# Patient Record
Sex: Female | Born: 1990 | Race: Black or African American | Hispanic: No | Marital: Single | Smoking: Never smoker
Health system: Southern US, Community
[De-identification: ages and names within clinical notes are randomized; demographics above are authoritative.]

## PROBLEM LIST (undated history)

## (undated) ENCOUNTER — Inpatient Hospital Stay (HOSPITAL_COMMUNITY): Payer: Self-pay

## (undated) DIAGNOSIS — B999 Unspecified infectious disease: Secondary | ICD-10-CM

## (undated) DIAGNOSIS — N390 Urinary tract infection, site not specified: Secondary | ICD-10-CM

## (undated) DIAGNOSIS — A6000 Herpesviral infection of urogenital system, unspecified: Secondary | ICD-10-CM

## (undated) DIAGNOSIS — B379 Candidiasis, unspecified: Secondary | ICD-10-CM

## (undated) HISTORY — PX: NO PAST SURGERIES: SHX2092

---

## 2012-04-21 ENCOUNTER — Ambulatory Visit (INDEPENDENT_AMBULATORY_CARE_PROVIDER_SITE_OTHER): Payer: BC Managed Care – PPO | Admitting: Physician Assistant

## 2012-04-21 VITALS — BP 102/60 | HR 76 | Temp 98.6°F | Resp 16 | Ht 64.5 in | Wt 256.0 lb

## 2012-04-21 DIAGNOSIS — R21 Rash and other nonspecific skin eruption: Secondary | ICD-10-CM

## 2012-04-21 DIAGNOSIS — Z2089 Contact with and (suspected) exposure to other communicable diseases: Secondary | ICD-10-CM

## 2012-04-21 MED ORDER — TRIAMCINOLONE ACETONIDE 0.1 % EX CREA: TOPICAL_CREAM | Freq: Two times a day (BID) | CUTANEOUS | Status: DC

## 2012-04-21 MED ORDER — IVERMECTIN 3 MG PO TABS: 3.0000 mg | ORAL_TABLET | Freq: Once | ORAL | Status: DC

## 2012-04-21 NOTE — Progress Notes (Signed)
  Subjective:    Patient ID: Brandi Farmer, female    DOB: 10-04-1991, 21 y.o.   MRN: [id number]  HPI  Pt presents to clinic after an exposure to scabies.  Her mother has recently been diagnosed and she lives at home.  She has a rash on her chest bilaterally that is itchy and then some scattered rash on her wrists.  She thinks the rash has spread but also states she gets worried about catching things and she may be making up the itching.  She has not tried any medications.  Review of Systems  Constitutional: Negative for fever.  Skin: Positive for rash.       Objective:   Physical Exam  Vitals reviewed. Constitutional: She appears well-developed and well-nourished.  HENT:  Head: Normocephalic and atraumatic.  Right Ear: External ear normal.  Left Ear: External ear normal.  Nose: Nose normal.  Eyes: Conjunctivae are normal.  Pulmonary/Chest: Effort normal.  Skin: Skin is warm. Rash noted.             Assessment & Plan:   1. Rash  triamcinolone cream (KENALOG) 0.1 %  2. Scabies exposure  ivermectin (STROMECTOL) 3 MG TABS   D/w pt I believe her chest rash is a contact type rash - even though no new known exposures per patient. Gave her treatment in case she develops symptoms.

## 2012-04-21 NOTE — Patient Instructions (Signed)
Scabies Scabies are small bugs (mites) that burrow under the skin and cause red bumps and severe itching. These bugs can only be seen with a microscope. Scabies are highly contagious. They can spread easily from person to person by direct contact. They are also spread through sharing clothing or linens that have the scabies mites living in them. It is not unusual for an entire family to become infected through shared towels, clothing, or bedding.  HOME CARE INSTRUCTIONS   Your caregiver may prescribe a cream or lotion to kill the mites. If this cream is prescribed; massage the cream into the entire area of the body from the neck to the bottom of both feet. Also massage the cream into the scalp and face if your child is less than 1 year old. Avoid the eyes and mouth.   Leave the cream on for 8 to12 hours. Do not wash your hands after application. Your child should bathe or shower after the 8 to 12 hour application period. Sometimes it is helpful to apply the cream to your child at right before bedtime.   One treatment is usually effective and will eliminate approximately 95% of infestations. For severe cases, your caregiver may decide to repeat the treatment in 1 week. Everyone in your household should be treated with one application of the cream.   New rashes or burrows should not appear after successful treatment within 24 to 48 hours; however the itching and rash may last for 2 to 4 weeks after successful treatment. If your symptoms persist longer than this, see your caregiver.   Your caregiver also may prescribe a medication to help with the itching or to help the rash go away more quickly.   Scabies can live on clothing or linens for up to 3 days. Your entire child's recently used clothing, towels, stuffed toys, and bed linens should be washed in hot water and then dried in a dryer for at least 20 minutes on high heat. Items that cannot be washed should be enclosed in a plastic bag for at least 3  days.   To help relieve itching, bathe your child in a cool bath or apply cool washcloths to the affected areas.   Your child may return to school after treatment with the prescribed cream.  SEEK MEDICAL CARE IF:   The itching persists longer than 4 weeks after treatment.   The rash spreads or becomes infected (the area has red blisters or yellow-tan crust).  Document Released: 09/28/2005 Document Revised: 09/17/2011 Document Reviewed: 02/06/2009 [employer] Patient Information 2012 [employer]. 

## 2012-08-29 ENCOUNTER — Inpatient Hospital Stay (HOSPITAL_COMMUNITY)
Admission: AD | Admit: 2012-08-29 | Discharge: 2012-08-29 | Disposition: A | Discharge status: Home / Self Care | Payer: Self-pay | Source: Ambulatory Visit | Attending: Obstetrics & Gynecology | Admitting: Obstetrics & Gynecology

## 2012-08-29 ENCOUNTER — Encounter (HOSPITAL_COMMUNITY): Payer: Self-pay | Admitting: Family

## 2012-08-29 DIAGNOSIS — N760 Acute vaginitis: Secondary | ICD-10-CM | POA: Insufficient documentation

## 2012-08-29 DIAGNOSIS — N949 Unspecified condition associated with female genital organs and menstrual cycle: Secondary | ICD-10-CM | POA: Insufficient documentation

## 2012-08-29 HISTORY — DX: Candidiasis, unspecified: B37.9

## 2012-08-29 HISTORY — DX: Unspecified infectious disease: B99.9

## 2012-08-29 HISTORY — DX: Urinary tract infection, site not specified: N39.0

## 2012-08-29 LAB — WET PREP, GENITAL
Trich, Wet Prep: NONE SEEN
Yeast Wet Prep HPF POC: NONE SEEN

## 2012-08-29 MED ORDER — FLUCONAZOLE 150 MG PO TABS: ORAL_TABLET | ORAL | Status: DC

## 2012-08-29 MED ORDER — CLOTRIMAZOLE-BETAMETHASONE 1-0.05 % EX CREA: TOPICAL_CREAM | Freq: Two times a day (BID) | CUTANEOUS | Status: DC

## 2012-08-29 NOTE — MAU Provider Note (Signed)
History     CSN: [id number]  Arrival date and time: 08/29/12 1131   First Provider Initiated Contact with Patient 08/29/12 1216      Chief Complaint  Patient presents with  . Vaginal Pain   HPI 21 y.o. G0P0 presents with vaginal pain for 2 days. 3 days ago she noticed a white vaginal discharge and started using OTC Monistat. She used a rag to scratch her vaginal area due to itchiness and ever since then the area has felt raw and she has pain with urinating or if anything touches that area. The pain is located near her clitoris and urethral opening. She started her period on the 17th. Did not use Monistat cream last night due to pain.    Past Medical History  Diagnosis Date  . Infection   . Yeast infection   . UTI (lower urinary tract infection)     History reviewed. No pertinent past surgical history.  History reviewed. No pertinent family history.  History  Substance Use Topics  . Smoking status: Never Smoker   . Smokeless tobacco: Not on file  . Alcohol Use: No    Allergies: No Known Allergies  Prescriptions prior to admission  Medication Sig Dispense Refill  . naproxen sodium (ANAPROX) 220 MG tablet Take 660 mg by mouth daily as needed. For headache        Review of Systems  Constitutional: Negative.   Gastrointestinal: Negative.   Genitourinary: Positive for dysuria. Negative for urgency, frequency, hematuria and flank pain.  Neurological: Negative.   Psychiatric/Behavioral: Negative.    Physical Exam   Blood pressure 123/69, pulse 78, temperature 98.5 F (36.9 C), temperature source Oral, resp. rate 18, height 5\' 5"  (1.651 m), weight 272 lb 12.8 oz (123.741 kg), last menstrual period 08/28/2012, SpO2 100.00%.  Physical Exam  Nursing note and vitals reviewed. Constitutional: She is oriented to person, place, and time. She appears well-developed and well-nourished. No distress.  HENT:  Head: Normocephalic and atraumatic.  Cardiovascular: Normal rate.     Respiratory: Effort normal.  GI: Soft. Bowel sounds are normal. She exhibits no mass. There is no tenderness. There is no rebound and no guarding.  Genitourinary: There is no rash, tenderness or lesion on the right labia. There is no rash, tenderness or lesion on the left labia. Uterus is not enlarged (Size c/w dates) and not tender. Cervix exhibits no motion tenderness, no discharge and no friability. Right adnexum displays no mass, no tenderness and no fullness. Left adnexum displays no mass, no tenderness and no fullness. No tenderness or bleeding around the vagina. No vaginal discharge found.       Small abrasions noted on labia minora near lateral to urethral meatus  Musculoskeletal: Normal range of motion.  Neurological: She is alert and oriented to person, place, and time.  Skin: Skin is warm and dry.  Psychiatric: She has a normal mood and affect.    MAU Course  Procedures  MDM Results for orders placed during the hospital encounter of 08/29/12 (from the past 24 hour(s))  WET PREP, GENITAL     Status: Abnormal   Collection Time   08/29/12 12:57 PM      Component Value Range   Yeast Wet Prep HPF POC NONE SEEN  NONE SEEN   Trich, Wet Prep NONE SEEN  NONE SEEN   Clue Cells Wet Prep HPF POC NONE SEEN  NONE SEEN   WBC, Wet Prep HPF POC FEW (*) NONE SEEN    Assessment  and Plan   1. Vaginitis       Medication List     As of 08/29/2012  4:38 PM    START taking these medications         clotrimazole-betamethasone cream   Commonly known as: LOTRISONE   Apply topically 2 (two) times daily.      fluconazole 150 MG tablet   Commonly known as: DIFLUCAN   1 tab po now, repeat dose in 2 days.      CONTINUE taking these medications         naproxen sodium 220 MG tablet   Commonly known as: ANAPROX          Where to get your medications    These are the prescriptions that you need to pick up. We sent them to a specific pharmacy, so you will need to go there to get them.    [facility] [address] [place], [place] - [address]    [address] [place] [address]    Phone: [phone]        clotrimazole-betamethasone cream   fluconazole 150 MG tablet            Follow-up Information    Follow up with [facility]. (As needed)    Contact information:   [address] [place] [address] [phone]           Alexa Golebiewski 08/29/2012, 2:04 PM

## 2012-08-29 NOTE — MAU Provider Note (Signed)
Attestation of Attending Supervision of Advanced Practitioner (CNM/NP): Evaluation and management procedures were performed by the Advanced Practitioner under my supervision and collaboration.  I have reviewed the Advanced Practitioner's note and chart, and I agree with the management and plan.  HARRAWAY-SMITH, [place] 11:37 PM     

## 2012-08-29 NOTE — MAU Note (Signed)
Pt reports having yeast infection and began 7-day Monistat treatment;  Yesterday would have been 3rd day of treatment - she used it two days and had terrible itching; reports she scratched with washcloth and now is swollen and scraped on meatus.  LMP began 11/17; has had yeast infection before period for last two cycles.

## 2012-08-29 NOTE — MAU Note (Signed)
Patient states she thought she had a yeast infection 3 days ago and used OTC medication for three days. Had a lot of itching and when scratching with a rag has small lacerations. Now having a lot of burning and pain with urination and vaginal swelling.

## 2012-08-30 LAB — GC/CHLAMYDIA PROBE AMP, GENITAL: Chlamydia, DNA Probe: NEGATIVE

## 2012-10-01 ENCOUNTER — Encounter (HOSPITAL_COMMUNITY): Payer: Self-pay | Admitting: Family Medicine

## 2012-10-01 ENCOUNTER — Emergency Department (HOSPITAL_COMMUNITY)
Admission: EM | Admit: 2012-10-01 | Discharge: 2012-10-01 | Disposition: A | Discharge status: Home / Self Care | Payer: Self-pay | Attending: Emergency Medicine | Admitting: Emergency Medicine

## 2012-10-01 DIAGNOSIS — R5383 Other fatigue: Secondary | ICD-10-CM | POA: Insufficient documentation

## 2012-10-01 DIAGNOSIS — R509 Fever, unspecified: Secondary | ICD-10-CM | POA: Insufficient documentation

## 2012-10-01 DIAGNOSIS — Z87440 Personal history of urinary (tract) infections: Secondary | ICD-10-CM | POA: Insufficient documentation

## 2012-10-01 DIAGNOSIS — R5381 Other malaise: Secondary | ICD-10-CM | POA: Insufficient documentation

## 2012-10-01 DIAGNOSIS — J40 Bronchitis, not specified as acute or chronic: Secondary | ICD-10-CM | POA: Insufficient documentation

## 2012-10-01 DIAGNOSIS — J029 Acute pharyngitis, unspecified: Secondary | ICD-10-CM | POA: Insufficient documentation

## 2012-10-01 DIAGNOSIS — Z8619 Personal history of other infectious and parasitic diseases: Secondary | ICD-10-CM | POA: Insufficient documentation

## 2012-10-01 DIAGNOSIS — J3489 Other specified disorders of nose and nasal sinuses: Secondary | ICD-10-CM | POA: Insufficient documentation

## 2012-10-01 LAB — RAPID STREP SCREEN (MED CTR MEBANE ONLY): Streptococcus, Group A Screen (Direct): NEGATIVE

## 2012-10-01 MED ORDER — AZITHROMYCIN 250 MG PO TABS: 250.0000 mg | ORAL_TABLET | Freq: Every day | ORAL | Status: DC

## 2012-10-01 NOTE — ED Notes (Signed)
Pt here c/o sore throat, cough and fever x3. Reports cough up greens sputum.

## 2012-10-01 NOTE — ED Notes (Signed)
Per pt sts a few days of productive cough, runny nose, weakness and fever. sts took a mucinex before she came.

## 2012-10-01 NOTE — ED Provider Notes (Signed)
History   This chart was scribed for Nelia Shi, MD, by Frederik Pear, ER scribe. The patient was seen in room TR07C/TR07C and the patient's care was started at 1107.    CSN: [id number]  Arrival date & time 10/01/12  0944   First MD Initiated Contact with Patient 10/01/12 1107      Chief Complaint  Patient presents with  . Cough  . Sore Throat  . Fever    (Consider location/radiation/quality/duration/timing/severity/associated sxs/prior treatment) HPI Comments: Brandi Farmer is a 21 y.o. female who presents to the Emergency Department complaining of a constant, moderate sore throat with an intermittent productive cough with green sputum, rhinorrhea that began 3 days ago.. She states that she took 2 Mucinex at home with no relief. She has no h/o of pneumonia or any other chronic medical conditions. 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 She reports that she is a non-smoker.     Past Medical History  Diagnosis Date  . Infection   . Yeast infection   . UTI (lower urinary tract infection)     History reviewed. No pertinent past surgical history.  History reviewed. No pertinent family history.  History  Substance Use Topics  . Smoking status: Never Smoker   . Smokeless tobacco: Not on file  . Alcohol Use: No    OB History    Grav Para Term Preterm Abortions TAB SAB Ect Mult Living   0 0              Review of Systems A complete 10 system review of systems was obtained and all systems are negative except as noted in the HPI and PMH.   Allergies  Review of patient's allergies indicates no known allergies.  Home Medications   Current Outpatient Rx  Name  Route  Sig  Dispense  Refill  . DM-GUAIFENESIN ER 30-600 MG PO TB12   Oral   Take 1 tablet by mouth every 12 (twelve) hours.         . AZITHROMYCIN 250 MG PO TABS   Oral   Take 1 tablet (250 mg total) by mouth daily. Take first 2 tablets together, then 1 every day  until finished.   6 tablet   0     BP 142/72  Pulse 87  Temp 99.9 F (37.7 C) (Oral)  Resp 16  SpO2 98%  LMP 09/24/2012  Physical Exam  Nursing note and vitals reviewed. Constitutional: She is oriented to person, place, and time. She appears well-developed and well-nourished. No distress.  HENT:  Head: Normocephalic and atraumatic.  Eyes: Pupils are equal, round, and reactive to light.  Neck: Normal range of motion.  Cardiovascular: Normal rate and intact distal pulses.   Pulmonary/Chest: No respiratory distress. She has no wheezes.  Abdominal: Normal appearance. She exhibits no distension.  Musculoskeletal: Normal range of motion.  Neurological: She is alert and oriented to person, place, and time. No cranial nerve deficit.  Skin: Skin is warm and dry. No rash noted.  Psychiatric: She has a normal mood and affect. Her behavior is normal.    ED Course  Procedures (including critical care time)  DIAGNOSTIC STUDIES: Oxygen Saturation is 98% on room air, normal by my interpretation.    COORDINATION OF CARE:  11:12- Discussed planned course of treatment with the patient, including antibiotics, who is agreeable at this time.     Labs Reviewed  RAPID STREP SCREEN   No results found.  1. Bronchitis       MDM  I personally performed the services described in this documentation, which was scribed in my presence. The recorded information has been reviewed and considered.      Nelia Shi, MD 10/01/12 [phone]

## 2012-10-15 ENCOUNTER — Emergency Department (HOSPITAL_COMMUNITY)
Admission: EM | Admit: 2012-10-15 | Discharge: 2012-10-15 | Disposition: A | Discharge status: Home / Self Care | Payer: Managed Care, Other (non HMO) | Attending: Emergency Medicine | Admitting: Emergency Medicine

## 2012-10-15 ENCOUNTER — Encounter (HOSPITAL_COMMUNITY): Payer: Self-pay | Admitting: Nurse Practitioner

## 2012-10-15 DIAGNOSIS — Z87440 Personal history of urinary (tract) infections: Secondary | ICD-10-CM | POA: Insufficient documentation

## 2012-10-15 DIAGNOSIS — R350 Frequency of micturition: Secondary | ICD-10-CM | POA: Insufficient documentation

## 2012-10-15 DIAGNOSIS — B009 Herpesviral infection, unspecified: Secondary | ICD-10-CM

## 2012-10-15 DIAGNOSIS — R3915 Urgency of urination: Secondary | ICD-10-CM | POA: Insufficient documentation

## 2012-10-15 DIAGNOSIS — R300 Dysuria: Secondary | ICD-10-CM | POA: Insufficient documentation

## 2012-10-15 DIAGNOSIS — A6000 Herpesviral infection of urogenital system, unspecified: Secondary | ICD-10-CM | POA: Insufficient documentation

## 2012-10-15 DIAGNOSIS — B3731 Acute candidiasis of vulva and vagina: Secondary | ICD-10-CM | POA: Insufficient documentation

## 2012-10-15 DIAGNOSIS — Z3202 Encounter for pregnancy test, result negative: Secondary | ICD-10-CM | POA: Insufficient documentation

## 2012-10-15 DIAGNOSIS — B373 Candidiasis of vulva and vagina: Secondary | ICD-10-CM

## 2012-10-15 DIAGNOSIS — Z8742 Personal history of other diseases of the female genital tract: Secondary | ICD-10-CM | POA: Insufficient documentation

## 2012-10-15 LAB — CBC WITH DIFFERENTIAL/PLATELET
Basophils Absolute: 0 10*3/uL (ref 0.0–0.1)
HCT: 35.9 % — ABNORMAL LOW (ref 36.0–46.0)
Hemoglobin: 11.7 g/dL — ABNORMAL LOW (ref 12.0–15.0)
Lymphocytes Relative: 47 % — ABNORMAL HIGH (ref 12–46)
Monocytes Absolute: 0.6 10*3/uL (ref 0.1–1.0)
Neutro Abs: 3.1 10*3/uL (ref 1.7–7.7)
Neutrophils Relative %: 44 % (ref 43–77)
RDW: 12.7 % (ref 11.5–15.5)
WBC: 7 10*3/uL (ref 4.0–10.5)

## 2012-10-15 LAB — URINALYSIS, MICROSCOPIC ONLY
Bilirubin Urine: NEGATIVE
Glucose, UA: NEGATIVE mg/dL
Hgb urine dipstick: NEGATIVE
Ketones, ur: NEGATIVE mg/dL
Protein, ur: NEGATIVE mg/dL
pH: 6.5 (ref 5.0–8.0)

## 2012-10-15 LAB — WET PREP, GENITAL
Clue Cells Wet Prep HPF POC: NONE SEEN
Trich, Wet Prep: NONE SEEN
Yeast Wet Prep HPF POC: NONE SEEN

## 2012-10-15 LAB — COMPREHENSIVE METABOLIC PANEL
ALT: 20 U/L (ref 0–35)
AST: 20 U/L (ref 0–37)
Albumin: 3.9 g/dL (ref 3.5–5.2)
Alkaline Phosphatase: 51 U/L (ref 39–117)
CO2: 25 mEq/L (ref 19–32)
Chloride: 105 mEq/L (ref 96–112)
Creatinine, Ser: 0.77 mg/dL (ref 0.50–1.10)
GFR calc non Af Amer: >90 mL/min (ref >90)
Potassium: 3.4 mEq/L — ABNORMAL LOW (ref 3.5–5.1)
Total Bilirubin: 0.2 mg/dL — ABNORMAL LOW (ref 0.3–1.2)

## 2012-10-15 LAB — POCT PREGNANCY, URINE: Preg Test, Ur: NEGATIVE

## 2012-10-15 MED ORDER — VALACYCLOVIR HCL 1 G PO TABS: 1000.0000 mg | ORAL_TABLET | Freq: Three times a day (TID) | ORAL | Status: AC | PRN

## 2012-10-15 NOTE — ED Provider Notes (Addendum)
History     CSN: [id number]  Arrival date & time 10/15/12  1255   First MD Initiated Contact with Patient 10/15/12 1353      Chief Complaint  Patient presents with  . Abdominal Pain    (Consider location/radiation/quality/duration/timing/severity/associated sxs/prior treatment) Patient is a 22 y.o. female presenting with abdominal pain. The history is provided by the patient.  Abdominal Pain The primary symptoms of the illness include abdominal pain, dysuria and vaginal discharge. The primary symptoms of the illness do not include fever, nausea or vomiting. Episode onset: About one week ago. The onset of the illness was gradual. The problem has been gradually worsening.  The dysuria began 6 to 7 days ago. The discomfort is felt in the labia. The discomfort is moderate. She is currently sexually active. The dysuria is associated with discharge, frequency and urgency. The dysuria is not associated with vaginal pain.  The vaginal discharge is associated with dysuria.  The patient states that she believes she is currently not pregnant. The patient has not had a change in bowel habit. Additional symptoms associated with the illness include urgency and frequency.    Past Medical History  Diagnosis Date  . Infection   . Yeast infection   . UTI (lower urinary tract infection)     History reviewed. No pertinent past surgical history.  History reviewed. No pertinent family history.  History  Substance Use Topics  . Smoking status: Never Smoker   . Smokeless tobacco: Not on file  . Alcohol Use: No    OB History    Grav Para Term Preterm Abortions TAB SAB Ect Mult Living   0 0              Review of Systems  Constitutional: Negative for fever.  Gastrointestinal: Positive for abdominal pain. Negative for nausea and vomiting.  Genitourinary: Positive for dysuria, urgency, frequency and vaginal discharge. Negative for vaginal pain.  All other systems reviewed and are  negative.    Allergies  Review of patient's allergies indicates no known allergies.  Home Medications   Current Outpatient Rx  Name  Route  Sig  Dispense  Refill  . AZITHROMYCIN 250 MG PO TABS   Oral   Take 1 tablet (250 mg total) by mouth daily. Take first 2 tablets together, then 1 every day until finished.   6 tablet   0   . DM-GUAIFENESIN ER 30-600 MG PO TB12   Oral   Take 1 tablet by mouth every 12 (twelve) hours.           BP 127/73  Pulse 94  Temp 98 F (36.7 C) (Oral)  Resp 16  SpO2 100%  LMP 09/24/2012  Physical Exam  Nursing note and vitals reviewed. Constitutional: She is oriented to person, place, and time. She appears well-developed and well-nourished. No distress.  HENT:  Head: Normocephalic and atraumatic.  Mouth/Throat: Oropharynx is clear and moist.  Eyes: Conjunctivae normal and EOM are normal. Pupils are equal, round, and reactive to light.  Neck: Normal range of motion. Neck supple.  Cardiovascular: Normal rate, regular rhythm and intact distal pulses.   No murmur heard. Pulmonary/Chest: Effort normal and breath sounds normal. No respiratory distress. She has no wheezes. She has no rales.  Abdominal: Soft. She exhibits no distension. There is no tenderness. There is no rebound and no guarding.  Genitourinary:    Right adnexum displays no mass and no tenderness. Left adnexum displays no mass and no tenderness. Vaginal discharge  found.       Copious cottage cheese like discharge consistent with yeast  Musculoskeletal: Normal range of motion. She exhibits no edema and no tenderness.  Neurological: She is alert and oriented to person, place, and time.  Skin: Skin is warm and dry. No rash noted. No erythema.  Psychiatric: She has a normal mood and affect. Her behavior is normal.    ED Course  Procedures (including critical care time)  Labs Reviewed  CBC WITH DIFFERENTIAL - Abnormal; Notable for the following:    Hemoglobin 11.7 (*)     HCT  35.9 (*)     Lymphocytes Relative 47 (*)     All other components within normal limits  COMPREHENSIVE METABOLIC PANEL - Abnormal; Notable for the following:    Potassium 3.4 (*)     Total Bilirubin 0.2 (*)     All other components within normal limits  URINALYSIS, MICROSCOPIC ONLY - Abnormal; Notable for the following:    Leukocytes, UA SMALL (*)     All other components within normal limits  WET PREP, GENITAL - Abnormal; Notable for the following:    WBC, Wet Prep HPF POC FEW (*)     All other components within normal limits  LIPASE, BLOOD  POCT PREGNANCY, URINE  GC/CHLAMYDIA PROBE AMP  HERPES SIMPLEX VIRUS CULTURE   No results found.   1. Vaginal candida   2. Herpes       MDM   Jae Dire patient complaining of vaginal discharge, dysuria and vaginal lesions which have recurred. She states she had these approximately one month ago and was seen at limits. At that time she was negative her GC and Chlamydia and had a normal wet prep. She was given a cream for yeast which she did not use and her symptoms resolved. However now they've recurred approximately one week ago. Patient states she was on antibiotics 2 weeks ago for bronchitis. She denies any pain over the lesions and has no abdominal pain on exam. UA, UPT, wet prep and GC Chlamydia pending. Will do vaginal exam for further evaluation.  3:12 PM Wet prep unrevealing patient treated for yeast as she has copious amounts of cottage cheeselike discharge.  Lesions were cultured and herpes cultures.  Pt given diflucan.      Gwyneth Sprout, MD 10/15/12 1514  Gwyneth Sprout, MD 10/15/12 [phone]

## 2012-10-15 NOTE — ED Notes (Addendum)
Pt c/o lower abd pain and vaginal discharge x 1 week. Reports she has been experiencing vaginal burning and discharge intermittently over the past month.

## 2012-10-17 LAB — HERPES SIMPLEX VIRUS CULTURE

## 2012-11-03 ENCOUNTER — Emergency Department (HOSPITAL_COMMUNITY)
Admission: EM | Admit: 2012-11-03 | Discharge: 2012-11-03 | Disposition: A | Discharge status: Home / Self Care | Payer: Managed Care, Other (non HMO) | Attending: Emergency Medicine | Admitting: Emergency Medicine

## 2012-11-03 ENCOUNTER — Encounter (HOSPITAL_COMMUNITY): Payer: Self-pay | Admitting: Emergency Medicine

## 2012-11-03 DIAGNOSIS — Z20828 Contact with and (suspected) exposure to other viral communicable diseases: Secondary | ICD-10-CM | POA: Insufficient documentation

## 2012-11-03 DIAGNOSIS — Z2089 Contact with and (suspected) exposure to other communicable diseases: Secondary | ICD-10-CM

## 2012-11-03 DIAGNOSIS — Z8619 Personal history of other infectious and parasitic diseases: Secondary | ICD-10-CM | POA: Insufficient documentation

## 2012-11-03 DIAGNOSIS — Z87440 Personal history of urinary (tract) infections: Secondary | ICD-10-CM | POA: Insufficient documentation

## 2012-11-03 DIAGNOSIS — R21 Rash and other nonspecific skin eruption: Secondary | ICD-10-CM | POA: Insufficient documentation

## 2012-11-03 MED ORDER — PERMETHRIN 5 % EX CREA: TOPICAL_CREAM | CUTANEOUS | Status: DC

## 2012-11-03 MED ORDER — DIPHENHYDRAMINE HCL 25 MG PO TABS: 25.0000 mg | ORAL_TABLET | Freq: Four times a day (QID) | ORAL | Status: DC

## 2012-11-03 NOTE — ED Provider Notes (Signed)
Medical screening examination/treatment/procedure(s) were performed by non-physician practitioner and as supervising physician I was immediately available for consultation/collaboration.   Carleene Cooper III, MD 11/03/12 [phone]

## 2012-11-03 NOTE — ED Notes (Signed)
Pt presents to ED today with c/o itching. Pt states there was an outbreak of scabies at her job [employer] systems) and patients were treated for the past 3 days and started itching last night.

## 2012-11-03 NOTE — ED Notes (Signed)
Pt presents with generalized itching x 2 days.  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

## 2012-11-03 NOTE — ED Provider Notes (Signed)
History     CSN: [id number]  Arrival date & time 11/03/12  1200   First MD Initiated Contact with Patient 11/03/12 1452      Chief Complaint  Patient presents with  . Pruritis    (Consider location/radiation/quality/duration/timing/severity/associated sxs/prior treatment) HPI  22 year old female presents for evaluation of a rash.  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  There has been an outbreak of scabies.  She notice itching last night and also notices several bumps to her inner thigh. Complain of extreme pruritus. Pt is here concerning of scabies.  Denies fever, chills, cp, sob, throat swelling, n/v/d.  Denies medication changes, new pets, new detergent. No abdominal pain, headache, or fever.    Past Medical History  Diagnosis Date  . Infection   . Yeast infection   . UTI (lower urinary tract infection)     History reviewed. No pertinent past surgical history.  History reviewed. No pertinent family history.  History  Substance Use Topics  . Smoking status: Never Smoker   . Smokeless tobacco: Not on file  . Alcohol Use: No    OB History    Grav Para Term Preterm Abortions TAB SAB Ect Mult Living   0 0              Review of Systems  Constitutional:       10 Systems reviewed and all are negative for acute change except as noted in the HPI.     Allergies  Review of patient's allergies indicates no known allergies.  Home Medications  No current outpatient prescriptions on file.  BP 126/80  Pulse 99  Temp 98.2 F (36.8 C) (Oral)  Resp 14  SpO2 96%  Physical Exam  Nursing note and vitals reviewed. Constitutional: She is oriented to person, place, and time. She appears well-developed and well-nourished. No distress.  HENT:  Head: Atraumatic.  Eyes: Conjunctivae normal are normal.  Neck: Normal range of motion. Neck supple.  Abdominal: Soft. There is no tenderness.  Musculoskeletal: Normal range of motion. She exhibits no edema.  Neurological:  She is alert and oriented to person, place, and time.  Skin: Skin is warm. Rash (no rash in hands or feet.  small macular rash noted to R inner thigh. No petechia, pustule, or vesicular rash) noted.  Psychiatric: She has a normal mood and affect.    ED Course  Procedures (including critical care time)  Labs Reviewed - No data to display No results found.   No diagnosis found.  1. scabies  MDM  Scabies exposure.  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    No systemic changes, no airway compromised.  No redflag rash  BP 126/80  Pulse 99  Temp 98.2 F (36.8 C) (Oral)  Resp 14  SpO2 96%         Fayrene Helper, PA-C 11/03/12 1502

## 2012-11-26 ENCOUNTER — Other Ambulatory Visit: Payer: Self-pay

## 2013-01-23 ENCOUNTER — Emergency Department (HOSPITAL_COMMUNITY)
Admission: EM | Admit: 2013-01-23 | Discharge: 2013-01-23 | Disposition: A | Discharge status: Home / Self Care | Payer: Managed Care, Other (non HMO) | Attending: Emergency Medicine | Admitting: Emergency Medicine

## 2013-01-23 ENCOUNTER — Encounter (HOSPITAL_COMMUNITY): Payer: Self-pay | Admitting: Emergency Medicine

## 2013-01-23 DIAGNOSIS — Z79899 Other long term (current) drug therapy: Secondary | ICD-10-CM | POA: Insufficient documentation

## 2013-01-23 DIAGNOSIS — A6000 Herpesviral infection of urogenital system, unspecified: Secondary | ICD-10-CM

## 2013-01-23 DIAGNOSIS — Z8619 Personal history of other infectious and parasitic diseases: Secondary | ICD-10-CM | POA: Insufficient documentation

## 2013-01-23 DIAGNOSIS — Z87440 Personal history of urinary (tract) infections: Secondary | ICD-10-CM | POA: Insufficient documentation

## 2013-01-23 DIAGNOSIS — R209 Unspecified disturbances of skin sensation: Secondary | ICD-10-CM | POA: Insufficient documentation

## 2013-01-23 HISTORY — DX: Herpesviral infection of urogenital system, unspecified: A60.00

## 2013-01-23 MED ORDER — ACYCLOVIR 200 MG PO CAPS: 800.0000 mg | ORAL_CAPSULE | Freq: Two times a day (BID) | ORAL | Status: AC

## 2013-01-23 MED ORDER — ACYCLOVIR 200 MG PO CAPS
400.0000 mg | ORAL_CAPSULE | Freq: Once | ORAL | Status: AC
  Administered 2013-01-23: 400 mg via ORAL
  Filled 2013-01-23: qty 2

## 2013-01-23 MED ORDER — TRAMADOL HCL 50 MG PO TABS
50.0000 mg | ORAL_TABLET | Freq: Four times a day (QID) | ORAL | Status: DC | PRN
Start: 2013-01-23 — End: 2013-11-02

## 2013-01-23 NOTE — ED Provider Notes (Signed)
History     CSN: [id number]  Arrival date & time 01/23/13  1846   First MD Initiated Contact with Patient 01/23/13 2039      Chief Complaint  Patient presents with  . Vaginal Pain    (Consider location/radiation/quality/duration/timing/severity/associated sxs/prior treatment) HPI Comments: Patient with a known history of genital herpes, states this morning.  She noticed a burning, tingling sensation in the right posterior labia minora, when she did self exam.  She noticed she had several vesicular lesions.  Patient is a 22 y.o. female presenting with vaginal pain. The history is provided by the patient.  Vaginal Pain This is a recurrent problem. The current episode started today. The problem occurs intermittently. The problem has been gradually worsening. Pertinent negatives include no fever.    Past Medical History  Diagnosis Date  . Infection   . Yeast infection   . UTI (lower urinary tract infection)   . Herpes genitalia     History reviewed. No pertinent past surgical history.  No family history on file.  History  Substance Use Topics  . Smoking status: Never Smoker   . Smokeless tobacco: Not on file  . Alcohol Use: No    OB History   Grav Para Term Preterm Abortions TAB SAB Ect Mult Living   0 0              Review of Systems  Constitutional: Negative for fever.  Genitourinary: Positive for genital sores and vaginal pain. Negative for dysuria and pelvic pain.  All other systems reviewed and are negative.    Allergies  Review of patient's allergies indicates no known allergies.  Home Medications   Current Outpatient Rx  Name  Route  Sig  Dispense  Refill  . acyclovir (ZOVIRAX) 200 MG capsule   Oral   Take 4 capsules (800 mg total) by mouth 2 (two) times daily.   25 capsule   2   . traMADol (ULTRAM) 50 MG tablet   Oral   Take 1 tablet (50 mg total) by mouth every 6 (six) hours as needed for pain.   15 tablet   0     BP 146/86  Pulse 72   Temp(Src) 98.2 F (36.8 C) (Oral)  Resp 16  SpO2 100%  LMP 12/24/2012  Physical Exam  Constitutional: She is oriented to person, place, and time. She appears well-developed and well-nourished.  Eyes: Pupils are equal, round, and reactive to light.  Neck: Normal range of motion.  Cardiovascular: Normal rate.   Genitourinary:    There is lesion on the right labia.  Neurological: She is alert and oriented to person, place, and time.    ED Course  Procedures (including critical care time)  Labs Reviewed - No data to display No results found.   1. Recurrent genital herpes       MDM   I will prescribe acyclovir.  Your 800 mg twice a day for 5 days for recurrent outbreak of genital herpes.  I will give her prescription for medication.  In the generic form, that she can fill out.  [employer] and can afford.        Arman Filter, NP 01/23/13 2136

## 2013-01-23 NOTE — ED Notes (Signed)
Patient with vaginal pain, started yesterday.  Patient states she has Herpes, usually takes lysine for the pain, but the outbreak is worse than usual.  Patient does not have insurance that will pay for Valtrex and would like something else for the outbreak.

## 2013-01-24 NOTE — ED Provider Notes (Signed)
Medical screening examination/treatment/procedure(s) were performed by non-physician practitioner and as supervising physician I was immediately available for consultation/collaboration.   Nelia Shi, MD 01/24/13 [phone]

## 2013-04-07 ENCOUNTER — Emergency Department (INDEPENDENT_AMBULATORY_CARE_PROVIDER_SITE_OTHER)
Admission: EM | Admit: 2013-04-07 | Discharge: 2013-04-07 | Disposition: A | Discharge status: Home / Self Care | Payer: 59 | Source: Home / Self Care | Attending: Emergency Medicine | Admitting: Emergency Medicine

## 2013-04-07 ENCOUNTER — Encounter (HOSPITAL_COMMUNITY): Payer: Self-pay | Admitting: Emergency Medicine

## 2013-04-07 DIAGNOSIS — M779 Enthesopathy, unspecified: Secondary | ICD-10-CM

## 2013-04-07 MED ORDER — DICLOFENAC SODIUM 75 MG PO TBEC: 75.0000 mg | DELAYED_RELEASE_TABLET | Freq: Two times a day (BID) | ORAL | Status: DC

## 2013-04-07 MED ORDER — HYDROCODONE-ACETAMINOPHEN 5-325 MG PO TABS: ORAL_TABLET | ORAL | Status: DC

## 2013-04-07 NOTE — ED Notes (Signed)
Pt c/o right thumb pain onset 3 days... Reports it only hurts when she extends thumb back... Denies: fevers, inj/trauma, numbness, tingly... Works as a Lawyer and constantly working w/hands... She is able to make a fist w/no difficulty... She is alert w/no signs of acute distress; NAD

## 2013-04-07 NOTE — ED Provider Notes (Signed)
Chief Complaint:   Chief Complaint  Patient presents with  . 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 She denies any injury to the area or swelling. It hurts to flex or extend the thumb. There is no numbness, tingling, or weakness.  Review of Systems:  Other than noted above, the patient denies any of the following symptoms: Systemic:  No fevers, chills, or sweats.  No fatigue or tiredness. Musculoskeletal:  No joint pain, arthritis, bursitis, swelling, back pain, or neck pain.  Neurological:  No muscular weakness, paresthesias.  PMFSH:  Past medical history, family history, social history, meds, and allergies were reviewed.  No history of gout.    Physical Exam:   Vital signs:  BP 143/82  Pulse 70  Temp(Src) 97.7 F (36.5 C) (Oral)  Resp 18  SpO2 99%  LMP 03/26/2013 Gen:  Alert and oriented times 3.  In no distress. Musculoskeletal:  Exam of the hand reveals no swelling or deformity. There is pain to palpation overlying the extensor tendon of the thumb. She has pain with extension and with flexion.  Otherwise, all joints had a full a ROM with no swelling, bruising or deformity.  No edema, pulses full. Extremities were warm and pink.  Capillary refill was brisk.  Skin:  Clear, warm and dry.  No rash. Neuro:  Alert and oriented times 3.  Muscle strength was normal.  Sensation was intact to light touch.   Course in Urgent Care Center:   The thumb was splinted in a thumb spica splint.  Assessment:  The encounter diagnosis was Tendonitis.  Tendinitis involving the extensor tendon of the thumb probably from overuse.  Plan:   1.  The following meds were prescribed:   Discharge Medication List as of 04/07/2013  2:57 PM    START taking these medications   Details  diclofenac (VOLTAREN) 75 MG EC tablet Take  1 tablet (75 mg total) by mouth 2 (two) times daily., Starting 04/07/2013, Until Discontinued, Normal    HYDROcodone-acetaminophen (NORCO/VICODIN) 5-325 MG per tablet 1 to 2 tabs every 4 to 6 hours as needed for pain., Print       2.  The patient was instructed in symptomatic care, including rest and activity, elevation, application of ice and compression.  Appropriate handouts were given. 3.  The patient was told to return if becoming worse in any way, if no better in 3 or 4 days, and given some red flag symptoms such as worsening pain that would indicate earlier return.   4.  The patient was told to follow up with Dr. Mack Hook as necessary.     Reuben Likes, MD 04/07/13 2142

## 2013-08-17 ENCOUNTER — Other Ambulatory Visit: Payer: Self-pay

## 2013-09-18 ENCOUNTER — Emergency Department (INDEPENDENT_AMBULATORY_CARE_PROVIDER_SITE_OTHER)
Admission: EM | Admit: 2013-09-18 | Discharge: 2013-09-18 | Disposition: A | Discharge status: Home / Self Care | Payer: Self-pay | Source: Home / Self Care | Attending: Family Medicine | Admitting: Family Medicine

## 2013-09-18 ENCOUNTER — Encounter (HOSPITAL_COMMUNITY): Payer: Self-pay | Admitting: Emergency Medicine

## 2013-09-18 DIAGNOSIS — N390 Urinary tract infection, site not specified: Secondary | ICD-10-CM

## 2013-09-18 LAB — POCT URINALYSIS DIP (DEVICE)
Bilirubin Urine: NEGATIVE
Glucose, UA: NEGATIVE mg/dL
Nitrite: NEGATIVE
Specific Gravity, Urine: 1.025 (ref 1.005–1.030)
Urobilinogen, UA: 2 mg/dL — ABNORMAL HIGH (ref 0.0–1.0)

## 2013-09-18 LAB — POCT PREGNANCY, URINE: Preg Test, Ur: NEGATIVE

## 2013-09-18 MED ORDER — CEPHALEXIN 500 MG PO CAPS: 500.0000 mg | ORAL_CAPSULE | Freq: Four times a day (QID) | ORAL | Status: DC

## 2013-09-18 NOTE — ED Provider Notes (Signed)
CSN: [id number]     Arrival date & time 09/18/13  1550 History   First MD Initiated Contact with Patient 09/18/13 1609     Chief Complaint  Patient presents with  . Abdominal Pain   (Consider location/radiation/quality/duration/timing/severity/associated sxs/prior Treatment) Patient is a 22 y.o. female presenting with abdominal pain. The history is provided by the patient.  Abdominal Pain This is a new problem. Episode onset: 2 week h/o sx. The problem occurs constantly. The problem has not changed since onset.Associated symptoms include abdominal pain.    Past Medical History  Diagnosis Date  . Infection   . Yeast infection   . UTI (lower urinary tract infection)   . Herpes genitalia    History reviewed. No pertinent past surgical history. History reviewed. No pertinent family history. History  Substance Use Topics  . Smoking status: Never Smoker   . Smokeless tobacco: Not on file  . Alcohol Use: No   OB History   Grav Para Term Preterm Abortions TAB SAB Ect Mult Living   0 0             Review of Systems  Constitutional: Negative for fever and chills.  Respiratory: Negative.   Gastrointestinal: Positive for nausea and abdominal pain. Negative for vomiting, diarrhea and constipation.  Genitourinary: Positive for pelvic pain. Negative for dysuria, frequency, hematuria, vaginal bleeding, vaginal discharge and menstrual problem.  Musculoskeletal: Negative.   Skin: Negative.     Allergies  Review of patient's allergies indicates no known allergies.  Home Medications   Current Outpatient Rx  Name  Route  Sig  Dispense  Refill  . cephALEXin (KEFLEX) 500 MG capsule   Oral   Take 1 capsule (500 mg total) by mouth 4 (four) times daily. Take all of medicine and drink lots of fluids   20 capsule   0   . diclofenac (VOLTAREN) 75 MG EC tablet   Oral   Take 1 tablet (75 mg total) by mouth 2 (two) times daily.   20 tablet   0   . HYDROcodone-acetaminophen  (NORCO/VICODIN) 5-325 MG per tablet      1 to 2 tabs every 4 to 6 hours as needed for pain.   20 tablet   0   . traMADol (ULTRAM) 50 MG tablet   Oral   Take 1 tablet (50 mg total) by mouth every 6 (six) hours as needed for pain.   15 tablet   0    BP 112/71  Pulse 69  Temp(Src) 97.5 F (36.4 C) (Oral)  Resp 20  SpO2 100%  LMP 08/23/2013 Physical Exam  Nursing note and vitals reviewed. Constitutional: She is oriented to person, place, and time. She appears well-developed and well-nourished.  Abdominal: Soft. Normal appearance and bowel sounds are normal. She exhibits no distension and no mass. There is no hepatosplenomegaly. There is tenderness in the suprapubic area. There is no rebound, no guarding and no CVA tenderness.    Neurological: She is alert and oriented to person, place, and time.  Skin: Skin is warm and dry.    ED Course  Procedures (including critical care time) Labs Review Labs Reviewed  POCT URINALYSIS DIP (DEVICE) - Abnormal; Notable for the following:    Hgb urine dipstick MODERATE (*)    Protein, ur 30 (*)    Urobilinogen, UA 2.0 (*)    Leukocytes, UA TRACE (*)    All other components within normal limits  URINE CULTURE  POCT PREGNANCY, URINE   Imaging  Review No results found.  EKG Interpretation    Date/Time:    Ventricular Rate:    PR Interval:    QRS Duration:   QT Interval:    QTC Calculation:   R Axis:     Text Interpretation:              MDM  U/a abnl.    Linna Hoff, MD 09/18/13 1700

## 2013-09-18 NOTE — ED Notes (Signed)
C/o LLQ abd pain States she does have a nausea feeling States pain is dull and achy States she does have some odor in her urine.

## 2013-09-20 LAB — URINE CULTURE: Colony Count: >=100000

## 2013-10-26 ENCOUNTER — Emergency Department (HOSPITAL_COMMUNITY)
Admission: EM | Admit: 2013-10-26 | Discharge: 2013-10-26 | Disposition: A | Discharge status: Home / Self Care | Payer: BC Managed Care – PPO | Source: Home / Self Care | Attending: Family Medicine | Admitting: Family Medicine

## 2013-10-26 ENCOUNTER — Encounter (HOSPITAL_COMMUNITY): Payer: Self-pay | Admitting: Emergency Medicine

## 2013-10-26 DIAGNOSIS — O034 Incomplete spontaneous abortion without complication: Secondary | ICD-10-CM

## 2013-10-26 LAB — POCT PREGNANCY, URINE: Preg Test, Ur: POSITIVE — AB

## 2013-10-26 NOTE — ED Notes (Signed)
C/o vaginal bleeding States she is pregnant but would like to abort the child

## 2013-10-26 NOTE — Discharge Instructions (Signed)
I think you have had a miscarriage. Followup with OB/GYN. [facility] [address] [place], [place] [phone]  [facility] [address] [place], [place] [phone]  [facility]: Silverio Layivard Sandra MD [address] [place], [place] [phone]  [facility] [address] [place], [place] [phone]  [facility]: Marcelle OverlieGrewal Michelle MD [address] [place], [place] [phone]  [facility]: Tracey HarriesHenley Thomas F MD [address] [place], [place] [phone]  [facility] [address] [place], [place] [phone]   [facility]: [address], [place], [place] [address] [phone]     Miscarriage A miscarriage is the sudden loss of an unborn baby (fetus) before the 20th week of pregnancy. Most miscarriages happen in the first 3 months of pregnancy. Sometimes, it happens before a woman even knows she is pregnant. A miscarriage is also called a "spontaneous miscarriage" or "early pregnancy loss." Having a miscarriage can be an emotional experience. Talk with your caregiver about any questions you may have about miscarrying, the grieving process, and your future pregnancy plans. CAUSES   Problems with the fetal chromosomes that make it impossible for the baby to develop normally. Problems with the baby's genes or chromosomes are most often the result of errors that occur, by chance, as the embryo divides and grows. The problems are not inherited from the parents.  Infection of the cervix or uterus.   Hormone problems.   Problems with the cervix, such as having an incompetent cervix. This is when the tissue in the cervix is not strong enough to hold the pregnancy.   Problems with the uterus, such as an abnormally shaped uterus, uterine fibroids, or congenital abnormalities.   Certain  medical conditions.   Smoking, drinking alcohol, or taking illegal drugs.   Trauma.  Often, the cause of a miscarriage is unknown.  SYMPTOMS   Vaginal bleeding or spotting, with or without cramps or pain.  Pain or cramping in the abdomen or lower back.  Passing fluid, tissue, or blood clots from the vagina. DIAGNOSIS  Your caregiver will perform a physical exam. You may also have an ultrasound to confirm the miscarriage. Blood or urine tests may also be ordered. TREATMENT   Sometimes, treatment is not necessary if you naturally pass all the fetal tissue that was in the uterus. If some of the fetus or placenta remains in the body (incomplete miscarriage), tissue left behind may become infected and must be removed. Usually, a dilation and curettage (D and C) procedure is performed. During a D and C procedure, the cervix is widened (dilated) and any remaining fetal or placental tissue is gently removed from the uterus.  Antibiotic medicines are prescribed if there is an infection. Other medicines may be given to reduce the size of the uterus (contract) if there is a lot of bleeding.  If you have Rh negative blood and your baby was Rh positive, you will need a Rh immunoglobulin shot. This shot will protect any future baby from having Rh blood problems in future pregnancies. HOME CARE INSTRUCTIONS   Your caregiver may order bed rest or may allow you to continue light activity. Resume activity as directed by your caregiver.  Have someone help with home and family responsibilities during this time.   Keep track of the number of sanitary pads you use each day and how soaked (saturated) they are.  Write down this information.   Do not use tampons. Do not douche or have sexual intercourse until approved by your caregiver.   Only take over-the-counter or prescription medicines for pain or discomfort as directed by your caregiver.   Do not take aspirin. Aspirin can cause bleeding.    Keep all follow-up appointments with your caregiver.   If you or your partner have problems with grieving, talk to your caregiver or seek counseling to help cope with the pregnancy loss. Allow enough time to grieve before trying to get pregnant again.  SEEK IMMEDIATE MEDICAL CARE IF:   You have severe cramps or pain in your back or abdomen.  You have a fever.  You pass large blood clots (walnut-sized or larger) ortissue from your vagina. Save any tissue for your caregiver to inspect.   Your bleeding increases.   You have a thick, bad-smelling vaginal discharge.  You become lightheaded, weak, or you faint.   You have chills.  MAKE SURE YOU:  Understand these instructions.  Will watch your condition.  Will get help right away if you are not doing well or get worse. Document Released: 03/24/2001 Document Revised: 01/23/2013 Document Reviewed: 11/17/2011 [facility] Patient Information 2014 [place], [place].

## 2013-10-26 NOTE — ED Provider Notes (Signed)
Jolyn Napiyhana Enderle is a 23 y.o. female who presents to Urgent Care today for menstrual bleeding while early stages of pregnancy. Patient had her last menstrual period at the end of November. She has had a positive home pregnancy test. She developed abdominal cramping and menstrual bleeding and passage of blood clots yesterday evening. This is largely resolved and she has a scant amount of menstrual bleeding currently. She denies any pain fevers chills nausea vomiting diarrhea. She feels well otherwise has not tried any medications. She was planning on having a elective abortion, as this was an unintended pregnancy. She does not have an OB/GYN doctor yet.   Past Medical History  Diagnosis Date  . Infection   . Yeast infection   . UTI (lower urinary tract infection)   . Herpes genitalia    History  Substance Use Topics  . Smoking status: Never Smoker   . Smokeless tobacco: Not on file  . Alcohol Use: No   ROS as above Medications: No current facility-administered medications for this encounter.   Current Outpatient Prescriptions  Medication Sig Dispense Refill  . cephALEXin (KEFLEX) 500 MG capsule Take 1 capsule (500 mg total) by mouth 4 (four) times daily. Take all of medicine and drink lots of fluids  20 capsule  0  . diclofenac (VOLTAREN) 75 MG EC tablet Take 1 tablet (75 mg total) by mouth 2 (two) times daily.  20 tablet  0  . HYDROcodone-acetaminophen (NORCO/VICODIN) 5-325 MG per tablet 1 to 2 tabs every 4 to 6 hours as needed for pain.  20 tablet  0  . traMADol (ULTRAM) 50 MG tablet Take 1 tablet (50 mg total) by mouth every 6 (six) hours as needed for pain.  15 tablet  0    Exam:  BP 123/74  Pulse 60  Temp(Src) 97.5 F (36.4 C) (Oral)  Resp 18  SpO2 100%  LMP 08/23/2013 Gen: Well NAD Abd: NABS, Soft. NT, ND Exts: Brisk capillary refill, warm and well perfused.   GYN: Normal external genitalia. Vaginal canal normal appearing. Cervical os is slightly open with small amount of  menstruation emerging. Nontender cervix. Uterus is small and nontender. No adnexal masses or tenderness.  Results for orders placed during the hospital encounter of 10/26/13 (from the past 24 hour(s))  POCT PREGNANCY, URINE     Status: Abnormal   Collection Time    10/26/13 12:24 PM      Result Value Range   Preg Test, Ur POSITIVE (*) NEGATIVE   No results found.  Assessment and Plan: 23 y.o. female with  incomplete abortion versus complete abortion. Plan to followup with OB/GYN. Provided patient with a list of OB/GYN providers.  Discussed warning signs or symptoms. Please see discharge instructions. Patient expresses understanding.    Rodolph BongEvan S Keamber Macfadden, MD 10/26/13 [phone]

## 2013-11-02 ENCOUNTER — Inpatient Hospital Stay (HOSPITAL_COMMUNITY)
Admission: AD | Admit: 2013-11-02 | Discharge: 2013-11-02 | Disposition: A | Discharge status: Home / Self Care | Payer: BC Managed Care – PPO | Source: Ambulatory Visit | Attending: Obstetrics & Gynecology | Admitting: Obstetrics & Gynecology

## 2013-11-02 ENCOUNTER — Inpatient Hospital Stay (HOSPITAL_COMMUNITY): Payer: BC Managed Care – PPO

## 2013-11-02 ENCOUNTER — Encounter (HOSPITAL_COMMUNITY): Payer: Self-pay

## 2013-11-02 DIAGNOSIS — O210 Mild hyperemesis gravidarum: Secondary | ICD-10-CM | POA: Insufficient documentation

## 2013-11-02 DIAGNOSIS — O30009 Twin pregnancy, unspecified number of placenta and unspecified number of amniotic sacs, unspecified trimester: Secondary | ICD-10-CM | POA: Insufficient documentation

## 2013-11-02 DIAGNOSIS — O209 Hemorrhage in early pregnancy, unspecified: Secondary | ICD-10-CM | POA: Insufficient documentation

## 2013-11-02 DIAGNOSIS — R109 Unspecified abdominal pain: Secondary | ICD-10-CM | POA: Insufficient documentation

## 2013-11-02 LAB — URINALYSIS, ROUTINE W REFLEX MICROSCOPIC
BILIRUBIN URINE: NEGATIVE
Glucose, UA: NEGATIVE mg/dL
Hgb urine dipstick: NEGATIVE
Ketones, ur: NEGATIVE mg/dL
Leukocytes, UA: NEGATIVE
Nitrite: NEGATIVE
PROTEIN: NEGATIVE mg/dL
Specific Gravity, Urine: 1.025 (ref 1.005–1.030)
UROBILINOGEN UA: 0.2 mg/dL (ref 0.0–1.0)
pH: 6.5 (ref 5.0–8.0)

## 2013-11-02 LAB — CBC
HCT: 35.8 % — ABNORMAL LOW (ref 36.0–46.0)
Hemoglobin: 12.1 g/dL (ref 12.0–15.0)
MCH: 26.5 pg (ref 26.0–34.0)
MCHC: 33.8 g/dL (ref 30.0–36.0)
MCV: 78.5 fL (ref 78.0–100.0)
PLATELETS: 230 10*3/uL (ref 150–400)
RBC: 4.56 MIL/uL (ref 3.87–5.11)
RDW: 12.7 % (ref 11.5–15.5)
WBC: 9.8 10*3/uL (ref 4.0–10.5)

## 2013-11-02 LAB — WET PREP, GENITAL
CLUE CELLS WET PREP: NONE SEEN
Trich, Wet Prep: NONE SEEN
Yeast Wet Prep HPF POC: NONE SEEN

## 2013-11-02 LAB — POCT PREGNANCY, URINE: Preg Test, Ur: POSITIVE — AB

## 2013-11-02 LAB — HCG, QUANTITATIVE, PREGNANCY: hCG, Beta Chain, Quant, S: 87272 m[IU]/mL — ABNORMAL HIGH (ref <5)

## 2013-11-02 MED ORDER — POLYETHYLENE GLYCOL 3350 17 G PO PACK: 17.0000 g | PACK | Freq: Every day | ORAL | Status: AC

## 2013-11-02 MED ORDER — COMPLETENATE 29-1 MG PO CHEW: 1.0000 | CHEWABLE_TABLET | Freq: Every day | ORAL | Status: AC

## 2013-11-02 NOTE — MAU Provider Note (Signed)
History     CSN: [id number]  Arrival date and time: 11/02/13 1021   None     Chief Complaint  Patient presents with  . Abdominal Pain  . Emesis   HPI This is a 23 y.o. female at 8633w1d by LMP who presents with c/o abdominal pain (all over) and vomiting. States she wants to find out if it was a miscarriage because she plans on having an abortion but does not want to pay for one if she is miscarrying.   RN Note:    Patient states she has been having abdominal pain since being seen at Urgent Care on 1-15. States she has vomiting when she eats anything since before that visit but is getting worse. Denies bleeding but has a slight discharge.      Urgent Care Note: Brandi Farmer is a 23 y.o. female who presents to Urgent Care today for menstrual bleeding while early stages of pregnancy. Patient had her last menstrual period at the end of November. She has had a positive home pregnancy test. She developed abdominal cramping and menstrual bleeding and passage of blood clots yesterday evening. This is largely resolved and she has a scant amount of menstrual bleeding currently. She denies any pain fevers chills nausea vomiting diarrhea. She feels well otherwise has not tried any medications. She was planning on having a elective abortion, as this was an unintended pregnancy. She does not have an OB/GYN doctor yet.    OB History   Grav Para Term Preterm Abortions TAB SAB Ect Mult Living   1 0        0      Past Medical History  Diagnosis Date  . Infection   . Yeast infection   . UTI (lower urinary tract infection)   . Herpes genitalia     Past Surgical History  Procedure Laterality Date  . No past surgeries      History reviewed. No pertinent family history.  History  Substance Use Topics  . Smoking status: Never Smoker   . Smokeless tobacco: Not on file  . Alcohol Use: No    Allergies: No Known Allergies  No prescriptions prior to admission    Review of Systems   Constitutional: Negative for fever, chills and malaise/fatigue.  Gastrointestinal: Positive for nausea and abdominal pain. Negative for vomiting, diarrhea and constipation.  Genitourinary: Negative for dysuria.  Neurological: Negative for dizziness.   Physical Exam   Blood pressure 127/68, pulse 81, temperature 98.8 F (37.1 C), temperature source Oral, resp. rate 20, height 5' 4.5" (1.638 m), weight 130.999 kg (288 lb 12.8 oz), last menstrual period 08/23/2013, SpO2 100.00%.  Physical Exam  Constitutional: She is oriented to person, place, and time. She appears well-developed and well-nourished. No distress.  Cardiovascular: Normal rate.   Respiratory: Effort normal.  GI: Soft.  Genitourinary: Vagina normal and uterus normal. No vaginal discharge found.  Musculoskeletal: Normal range of motion.  Neurological: She is alert and oriented to person, place, and time.  Skin: Skin is warm and dry.  Psychiatric: She has a normal mood and affect.  Difficult to assess uterine size and adnexa due to habitus  MAU Course  Procedures  MDM GC/Chlamydia and wet prep done. WIll check Quant and US.  Results for orders placed during the hospital encounter of 11/02/13 (from the past 72 hour(s))  URINALYSIS, ROUTINE W REFLEX MICROSCOPIC     Status: None   Collection Time    11/02/13 10:45 AM  Result Value Range   Color, Urine YELLOW  YELLOW   APPearance CLEAR  CLEAR   Specific Gravity, Urine 1.025  1.005 - 1.030   pH 6.5  5.0 - 8.0   Glucose, UA NEGATIVE  NEGATIVE mg/dL   Hgb urine dipstick NEGATIVE  NEGATIVE   Bilirubin Urine NEGATIVE  NEGATIVE   Ketones, ur NEGATIVE  NEGATIVE mg/dL   Protein, ur NEGATIVE  NEGATIVE mg/dL   Urobilinogen, UA 0.2  0.0 - 1.0 mg/dL   Nitrite NEGATIVE  NEGATIVE   Leukocytes, UA NEGATIVE  NEGATIVE   Comment: MICROSCOPIC NOT DONE ON URINES WITH NEGATIVE PROTEIN, BLOOD, LEUKOCYTES, NITRITE, OR GLUCOSE <1000 mg/dL.  WET PREP, GENITAL     Status: Abnormal    Collection Time    11/02/13 11:05 AM      Result Value Range   Yeast Wet Prep HPF POC NONE SEEN  NONE SEEN   Trich, Wet Prep NONE SEEN  NONE SEEN   Clue Cells Wet Prep HPF POC NONE SEEN  NONE SEEN   WBC, Wet Prep HPF POC FEW (*) NONE SEEN   Comment: MODERATE BACTERIA SEEN  POCT PREGNANCY, URINE     Status: Abnormal   Collection Time    11/02/13 11:22 AM      Result Value Range   Preg Test, Ur POSITIVE (*) NEGATIVE   Comment:            THE SENSITIVITY OF THIS     METHODOLOGY IS >24 mIU/mL  HCG, QUANTITATIVE, PREGNANCY     Status: Abnormal   Collection Time    11/02/13 11:50 AM      Result Value Range   hCG, Beta Chain, Quant, S 87272 (*) <5 mIU/mL   Comment:              GEST. AGE      CONC.  (mIU/mL)       <=1 WEEK        5 - 50         2 WEEKS       50 - 500         3 WEEKS       100 - 10,000         4 WEEKS     1,000 - 30,000         5 WEEKS     3,500 - 115,000       6-8 WEEKS     12,000 - 270,000        12 WEEKS     15,000 - 220,000                FEMALE AND NON-PREGNANT FEMALE:         LESS THAN 5 mIU/mL     Performed at [facility]  CBC     Status: Abnormal   Collection Time    11/02/13 11:50 AM      Result Value Range   WBC 9.8  4.0 - 10.5 K/uL   RBC 4.56  3.87 - 5.11 MIL/uL   Hemoglobin 12.1  12.0 - 15.0 g/dL   HCT 09.6 (*) 04.5 - 40.9 %   MCV 78.5  78.0 - 100.0 fL   MCH 26.5  26.0 - 34.0 pg   MCHC 33.8  30.0 - 36.0 g/dL   RDW 81.1  91.4 - 78.2 %   Platelets 230  150 - 400 K/uL   US Ob Comp Addl Gest Less 14 Wks  11/02/2013   CLINICAL DATA:  Bleeding and cramping. First trimester pregnancy with twins.  EXAM: TWIN OBSTETRIC <14 WK [place] AND TRANSVAGINAL OB US  COMPARISON:  None.  FINDINGS: TWIN 1  Intrauterine gestational sac: Visualized/normal in shape.  Yolk sac:  Present  Embryo:  Present  Cardiac Activity: Present  Heart Rate: 155 bpm  CRL:  3.3  mm   10 w 2d                  [place] EDC: 05/29/2014  TWIN 2  Intrauterine gestational sac: Visualized/normal in  shape.  Yolk sac:  Present  Embryo:  Present  Cardiac Activity: Present  Heart Rate: 158 bpm  CRL:  3.28  mm   10 w to d                  [place] EDC: 05/29/2014  Maternal uterus/adnexae: Normal.  No free fluid.    IMPRESSION: Normal first trimester appearance of twin pregnancy as above.     Electronically Signed   By: Gennette Pac M.D.   On: 11/02/2013 13:25   Assessment and Plan  A:  Viable Twin IUP at [redacted]w[redacted]d       First trimester bleeding, unknown  cause  P:  Discussed findings with patient      Proof of pregnancy letter given per request, pt is considering keeping pregnancy      Work note given      Start PNV and start [facility] soon, pt will call if she wants appointment         [facility] 11/02/2013, 11:12 AM

## 2013-11-02 NOTE — Discharge Instructions (Signed)
Constipation, Adult Constipation is when a person has fewer than 3 bowel movements a week; has difficulty having a bowel movement; or has stools that are dry, hard, or larger than normal. As people grow older, constipation is more common. If you try to fix constipation with medicines that make you have a bowel movement (laxatives), the problem may get worse. Long-term laxative use may cause the muscles of the colon to become weak. A low-fiber diet, not taking in enough fluids, and taking certain medicines may make constipation worse. CAUSES   Certain medicines, such as antidepressants, pain medicine, iron supplements, antacids, and water pills.   Certain diseases, such as diabetes, irritable bowel syndrome (IBS), thyroid disease, or depression.   Not drinking enough water.   Not eating enough fiber-rich foods.   Stress or travel.  Lack of physical activity or exercise.  Not going to the restroom when there is the urge to have a bowel movement.  Ignoring the urge to have a bowel movement.  Using laxatives too much. SYMPTOMS   Having fewer than 3 bowel movements a week.   Straining to have a bowel movement.   Having hard, dry, or larger than normal stools.   Feeling full or bloated.   Pain in the lower abdomen.  Not feeling relief after having a bowel movement. DIAGNOSIS  Your caregiver will take a medical history and perform a physical exam. Further testing may be done for severe constipation. Some tests may include:   A barium enema X-ray to examine your rectum, colon, and sometimes, your small intestine.  A sigmoidoscopy to examine your lower colon.  A colonoscopy to examine your entire colon. TREATMENT  Treatment will depend on the severity of your constipation and what is causing it. Some dietary treatments include drinking more fluids and eating more fiber-rich foods. Lifestyle treatments may include regular exercise. If these diet and lifestyle recommendations  do not help, your caregiver may recommend taking over-the-counter laxative medicines to help you have bowel movements. Prescription medicines may be prescribed if over-the-counter medicines do not work.  HOME CARE INSTRUCTIONS   Increase dietary fiber in your diet, such as fruits, vegetables, whole grains, and beans. Limit high-fat and processed sugars in your diet, such as [place] fries, hamburgers, cookies, candies, and soda.   A fiber supplement may be added to your diet if you cannot get enough fiber from foods.   Drink enough fluids to keep your urine clear or pale yellow.   Exercise regularly or as directed by your caregiver.   Go to the restroom when you have the urge to go. Do not hold it.  Only take medicines as directed by your caregiver. Do not take other medicines for constipation without talking to your caregiver first. SEEK IMMEDIATE MEDICAL CARE IF:   You have bright red blood in your stool.   Your constipation lasts for more than 4 days or gets worse.   You have abdominal or rectal pain.   You have thin, pencil-like stools.  You have unexplained weight loss. MAKE SURE YOU:   Understand these instructions.  Will watch your condition.  Will get help right away if you are not doing well or get worse. Document Released: 06/26/2004 Document Revised: 12/21/2011 Document Reviewed: 07/10/2013 [facility] Patient Information 2014 [place], [place]. Pregnancy - First Trimester During sexual intercourse, millions of sperm go into the vagina. Only 1 sperm will penetrate and fertilize the female egg while it is in the Fallopian tube. One week later, the fertilized  egg implants into the wall of the uterus. An embryo begins to develop into a baby. At 6 to 8 weeks, the eyes and face are formed and the heartbeat can be seen on ultrasound. At the end of 12 weeks (first trimester), all the baby's organs are formed. Now that you are pregnant, you will want to do everything you can to  have a healthy baby. Two of the most important things are to get good prenatal care and follow your caregiver's instructions. Prenatal care is all the medical care you receive before the baby's birth. It is given to prevent, find, and treat problems during the pregnancy and childbirth. PRENATAL EXAMS  During prenatal visits, your weight, blood pressure, and urine are checked. This is done to make sure you are healthy and progressing normally during the pregnancy.  A pregnant woman should gain 25 to 35 pounds during the pregnancy. However, if you are overweight or underweight, your caregiver will advise you regarding your weight.  Your caregiver will ask and answer questions for you.  Blood work, cervical cultures, other necessary tests, and a Pap test are done during your prenatal exams. These tests are done to check on your health and the probable health of your baby. Tests are strongly recommended and done for HIV with your permission. This is the virus that causes AIDS. These tests are done because medicines can be given to help prevent your baby from being born with this infection should you have been infected without knowing it. Blood work is also used to find out your blood type, previous infections, and follow your blood levels (hemoglobin).  Low hemoglobin (anemia) is common during pregnancy. Iron and vitamins are given to help prevent this. Later in the pregnancy, blood tests for diabetes will be done along with any other tests if any problems develop.  You may need other tests to make sure you and the baby are doing well. CHANGES DURING THE FIRST TRIMESTER  Your body goes through many changes during pregnancy. They vary from person to person. Talk to your caregiver about changes you notice and are concerned about. Changes can include:  Your menstrual period stops.  The egg and sperm carry the genes that determine what you look like. Genes from you and your partner are forming a baby. The  female genes determine whether the baby is a boy or a girl.  Your body increases in girth and you may feel bloated.  Feeling sick to your stomach (nauseous) and throwing up (vomiting). If the vomiting is uncontrollable, call your caregiver.  Your breasts will begin to enlarge and become tender.  Your nipples may stick out more and become darker.  The need to urinate more. Painful urination may mean you have a bladder infection.  Tiring easily.  Loss of appetite.  Cravings for certain kinds of food.  At first, you may gain or lose a couple of pounds.  You may have changes in your emotions from day to day (excited to be pregnant or concerned something may go wrong with the pregnancy and baby).  You may have more vivid and strange dreams. HOME CARE INSTRUCTIONS   It is very important to avoid all smoking, alcohol and non-prescribed drugs during your pregnancy. These affect the formation and growth of the baby. Avoid chemicals while pregnant to ensure the delivery of a healthy infant.  Start your prenatal visits by the 12th week of pregnancy. They are usually scheduled monthly at first, then more often in the  last 2 months before delivery. Keep your caregiver's appointments. Follow your caregiver's instructions regarding medicine use, blood and lab tests, exercise, and diet.  During pregnancy, you are providing food for you and your baby. Eat regular, well-balanced meals. Choose foods such as meat, fish, milk and other low fat dairy products, vegetables, fruits, and whole-grain breads and cereals. Your caregiver will tell you of the ideal weight gain.  You can help morning sickness by keeping soda crackers at the bedside. Eat a couple before arising in the morning. You may want to use the crackers without salt on them.  Eating 4 to 5 small meals rather than 3 large meals a day also may help the nausea and vomiting.  Drinking liquids between meals instead of during meals also seems to  help nausea and vomiting.  A physical sexual relationship may be continued throughout pregnancy if there are no other problems. Problems may be early (premature) leaking of amniotic fluid from the membranes, vaginal bleeding, or belly (abdominal) pain.  Exercise regularly if there are no restrictions. Check with your caregiver or physical therapist if you are unsure of the safety of some of your exercises. Greater weight gain will occur in the last 2 trimesters of pregnancy. Exercising will help:  Control your weight.  Keep you in shape.  Prepare you for labor and delivery.  Help you lose your pregnancy weight after you deliver your baby.  Wear a good support or jogging bra for breast tenderness during pregnancy. This may help if worn during sleep too.  Ask when prenatal classes are available. Begin classes when they are offered.  Do not use hot tubs, steam rooms, or saunas.  Wear your seat belt when driving. This protects you and your baby if you are in an accident.  Avoid raw meat, uncooked cheese, cat litter boxes, and soil used by cats throughout the pregnancy. These carry germs that can cause birth defects in the baby.  The first trimester is a good time to visit your dentist for your dental health. Getting your teeth cleaned is okay. Use a softer toothbrush and brush gently during pregnancy.  Ask for help if you have financial, counseling, or nutritional needs during pregnancy. Your caregiver will be able to offer counseling for these needs as well as refer you for other special needs.  Do not take any medicines or herbs unless told by your caregiver.  Inform your caregiver if there is any mental or physical domestic violence.  Make a list of emergency phone numbers of family, friends, hospital, and police and fire departments.  Write down your questions. Take them to your prenatal visit.  Do not douche.  Do not cross your legs.  If you have to stand for long periods of  time, rotate you feet or take small steps in a circle.  You may have more vaginal secretions that may require a sanitary pad. Do not use tampons or scented sanitary pads. MEDICINES AND DRUG USE IN PREGNANCY  Take prenatal vitamins as directed. The vitamin should contain 1 milligram of folic acid. Keep all vitamins out of reach of children. Only a couple vitamins or tablets containing iron may be fatal to a baby or young child when ingested.  Avoid use of all medicines, including herbs, over-the-counter medicines, not prescribed or suggested by your caregiver. Only take over-the-counter or prescription medicines for pain, discomfort, or fever as directed by your caregiver. Do not use aspirin, ibuprofen, or naproxen unless directed by your caregiver.  Let your caregiver also know about herbs you may be using.  Alcohol is related to a number of birth defects. This includes fetal alcohol syndrome. All alcohol, in any form, should be avoided completely. Smoking will cause low birth rate and premature babies.  Street or illegal drugs are very harmful to the baby. They are absolutely forbidden. A baby born to an addicted mother will be addicted at birth. The baby will go through the same withdrawal an adult does.  Let your caregiver know about any medicines that you have to take and for what reason you take them. SEEK MEDICAL CARE IF:  You have any concerns or worries during your pregnancy. It is better to call with your questions if you feel they cannot wait, rather than worry about them. SEEK IMMEDIATE MEDICAL CARE IF:   An unexplained oral temperature above 102 F (38.9 C) develops, or as your caregiver suggests.  You have leaking of fluid from the vagina (birth canal). If leaking membranes are suspected, take your temperature and inform your caregiver of this when you call.  There is vaginal spotting or bleeding. Notify your caregiver of the amount and how many pads are used.  You develop a  bad smelling vaginal discharge with a change in the color.  You continue to feel sick to your stomach (nauseated) and have no relief from remedies suggested. You vomit blood or coffee ground-like materials.  You lose more than 2 pounds of weight in 1 week.  You gain more than 2 pounds of weight in 1 week and you notice swelling of your face, hands, feet, or legs.  You gain 5 pounds or more in 1 week (even if you do not have swelling of your hands, face, legs, or feet).  You get exposed to [place] measles and have never had them.  You are exposed to fifth disease or chickenpox.  You develop belly (abdominal) pain. Round ligament discomfort is a common non-cancerous (benign) cause of abdominal pain in pregnancy. Your caregiver still must evaluate this.  You develop headache, fever, diarrhea, pain with urination, or shortness of breath.  You fall or are in a car accident or have any kind of trauma.  There is mental or physical violence in your home. Document Released: 09/22/2001 Document Revised: 06/22/2012 Document Reviewed: 03/26/2009 [facility] Patient Information 2014 [place], [place].

## 2013-11-02 NOTE — MAU Note (Signed)
Patient states she has been having abdominal pain since being seen at Urgent Care on 1-15. States she has vomiting when she eats anything since before that visit but is getting worse. Denies bleeding but has a slight discharge.

## 2013-11-03 LAB — GC/CHLAMYDIA PROBE AMP
CT Probe RNA: NEGATIVE
GC PROBE AMP APTIMA: NEGATIVE

## 2013-11-10 NOTE — MAU Provider Note (Signed)
Attestation of Attending Supervision of Advanced Practitioner (CNM/NP): Evaluation and management procedures were performed by the Advanced Practitioner under my supervision and collaboration. I have reviewed the Advanced Practitioner's note and chart, and I agree with the management and plan.  LEGGETT,KELLY H. 4:57 PM   

## 2013-11-12 ENCOUNTER — Encounter: Payer: Self-pay | Admitting: Advanced Practice Midwife

## 2013-11-12 DIAGNOSIS — O30049 Twin pregnancy, dichorionic/diamniotic, unspecified trimester: Secondary | ICD-10-CM | POA: Insufficient documentation

## 2013-11-25 ENCOUNTER — Emergency Department (HOSPITAL_COMMUNITY)
Admission: EM | Admit: 2013-11-25 | Discharge: 2013-11-25 | Disposition: A | Discharge status: Home / Self Care | Payer: BC Managed Care – PPO | Source: Home / Self Care | Attending: Emergency Medicine | Admitting: Emergency Medicine

## 2013-11-25 ENCOUNTER — Encounter (HOSPITAL_COMMUNITY): Payer: Self-pay | Admitting: Emergency Medicine

## 2013-11-25 DIAGNOSIS — M779 Enthesopathy, unspecified: Principal | ICD-10-CM

## 2013-11-25 DIAGNOSIS — M778 Other enthesopathies, not elsewhere classified: Secondary | ICD-10-CM

## 2013-11-25 DIAGNOSIS — M65839 Other synovitis and tenosynovitis, unspecified forearm: Secondary | ICD-10-CM

## 2013-11-25 DIAGNOSIS — M65849 Other synovitis and tenosynovitis, unspecified hand: Secondary | ICD-10-CM

## 2013-11-25 NOTE — ED Provider Notes (Signed)
Medical screening examination/treatment/procedure(s) were performed by non-physician practitioner and as supervising physician I was immediately available for consultation/collaboration.  Tavaughn Silguero, M.D.   Treysen Sudbeck C Yordi Krager, MD 11/25/13 2116 

## 2013-11-25 NOTE — ED Provider Notes (Signed)
CSN: [id number]     Arrival date & time 11/25/13  1509 History   First MD Initiated Contact with Patient 11/25/13 1624     Chief Complaint  Patient presents with  . Hand Pain     (Consider location/radiation/quality/duration/timing/severity/associated sxs/prior Treatment) HPI Comments: Patient presents with 1-2 weeks of pain and the base of her right thumb. States in the past she has been diagnosed with tendonitis and same area. Reports that she is right hand dominant and spend a great deal of time typing text messages on her phone. No reported specific injury or previous surgery. Began taking ibuprofen recently for discomfort. No changes in strength or sensation.   Patient is a 23 y.o. female presenting with hand pain. The history is provided by the patient.  Hand Pain    Past Medical History  Diagnosis Date  . Infection   . Yeast infection   . UTI (lower urinary tract infection)   . Herpes genitalia    Past Surgical History  Procedure Laterality Date  . No past surgeries     No family history on file. History  Substance Use Topics  . Smoking status: Never Smoker   . Smokeless tobacco: Not on file  . Alcohol Use: No   OB History   Grav Para Term Preterm Abortions TAB SAB Ect Mult Living   1 0        0     Review of Systems  All other systems reviewed and are negative.      Allergies  Review of patient's allergies indicates no known allergies.  Home Medications   Current Outpatient Rx  Name  Route  Sig  Dispense  Refill  . polyethylene glycol (MIRALAX) packet   Oral   Take 17 g by mouth daily.   14 each   0   . prenatal vitamin w/FE, FA (NATACHEW) 29-1 MG CHEW chewable tablet   Oral   Chew 1 tablet by mouth daily at 12 noon.   30 tablet   12    BP 143/99  Pulse 71  Temp(Src) 98.5 F (36.9 C) (Oral)  Resp 18  SpO2 100%  LMP 08/23/2013  Breastfeeding? Unknown Physical Exam  Nursing note and vitals reviewed. Constitutional: She is oriented to  person, place, and time. She appears well-developed and well-nourished. No distress.  HENT:  Head: Normocephalic and atraumatic.  Eyes: Conjunctivae are normal.  Cardiovascular: Normal rate.   Pulmonary/Chest: Effort normal.  Musculoskeletal:       Right hand: She exhibits tenderness. She exhibits normal range of motion, no bony tenderness, normal capillary refill, no deformity, no laceration and no swelling. Normal sensation noted. Normal strength noted.       Hands: Neurological: She is alert and oriented to person, place, and time.  Skin: Skin is warm and dry. No rash noted. No erythema.  Psychiatric: She has a normal mood and affect. Her behavior is normal.    ED Course  Procedures (including critical care time) Labs Review Labs Reviewed - No data to display Imaging Review No results found.    MDM   Final diagnoses:  None  Will advise continued use of ibuprofen, ice 3-4 x day and provide velcro splint for use during the day and to try to prevent overuse. Encouraged her to limit typing and texting to allow for reduction in discomfort over next several days.    Jess BartersJennifer Lee EspartoPresson, Georgia[place] 11/25/13 [phone]

## 2013-11-25 NOTE — Discharge Instructions (Signed)
Continue to use ibuprofen as directed. Ice and elevate 3-4 times a day. Wear splint during the day for the next 1-2 weeks. You must reduce/limit the time spent typing text messages to allow for rest and healing.

## 2014-08-13 ENCOUNTER — Encounter (HOSPITAL_COMMUNITY): Payer: Self-pay | Admitting: Emergency Medicine

## 2014-12-18 IMAGING — US US OB COMP LESS 14 WK
1 series · 14 of 28 positions shown · non-contrast
Comparison: None.

CLINICAL DATA: Bleeding and cramping. First trimester pregnancy
with twins.

EXAM:
TWIN OBSTETRIC <14 WK US AND TRANSVAGINAL OB US

[Series 1: us ob comp less 14 wks · 14 of 46 slices shown]
[im 2/46]
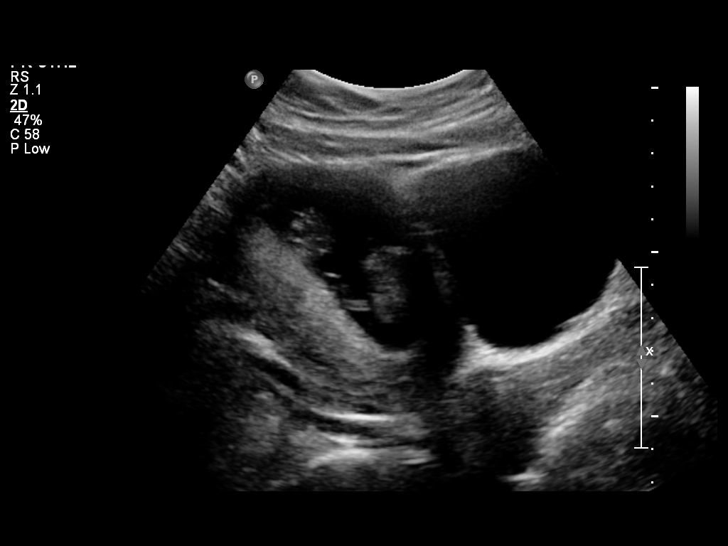
[im 6/46]
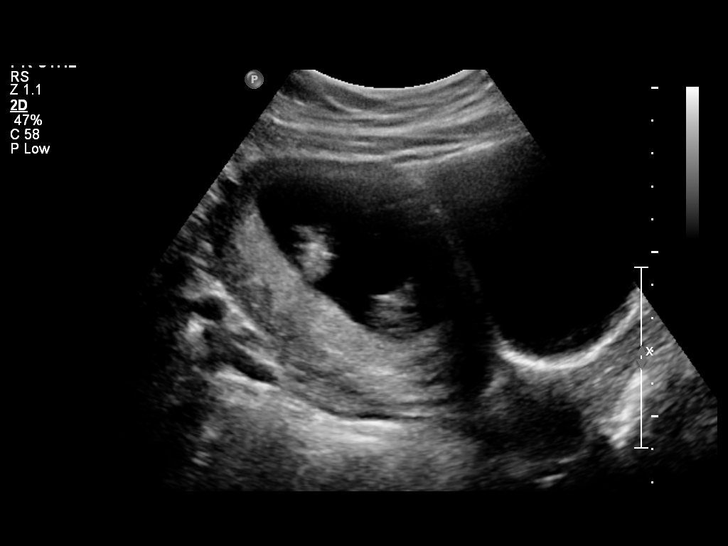
[im 9/46]
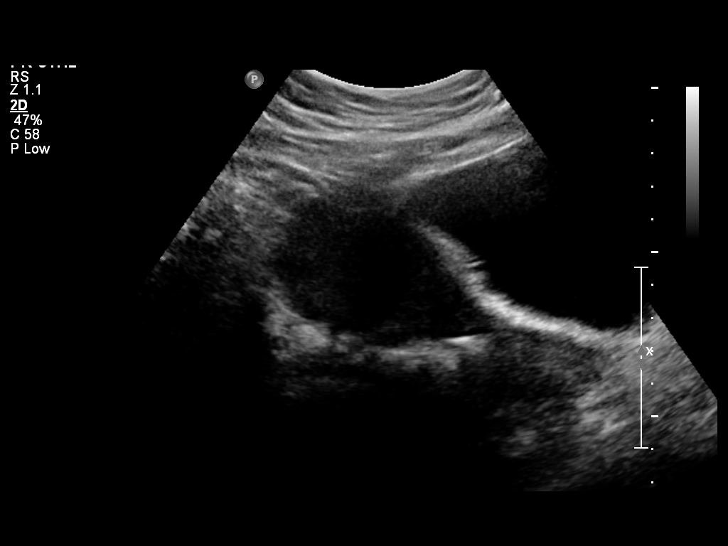
[im 12/46]
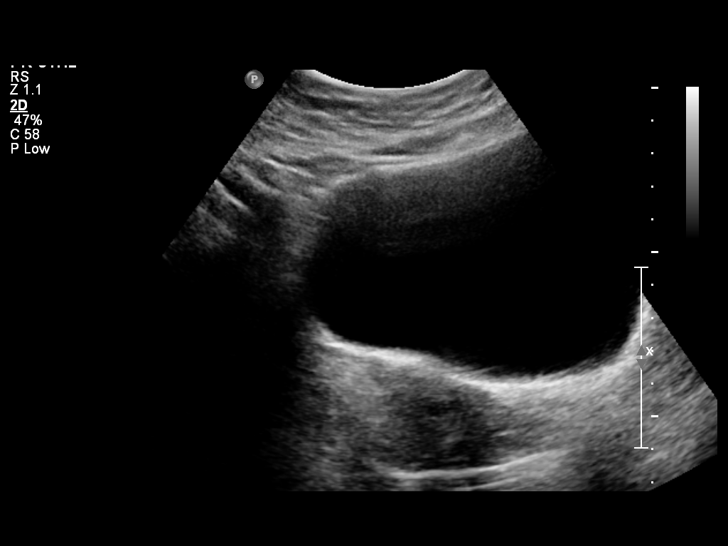
[im 16/46]
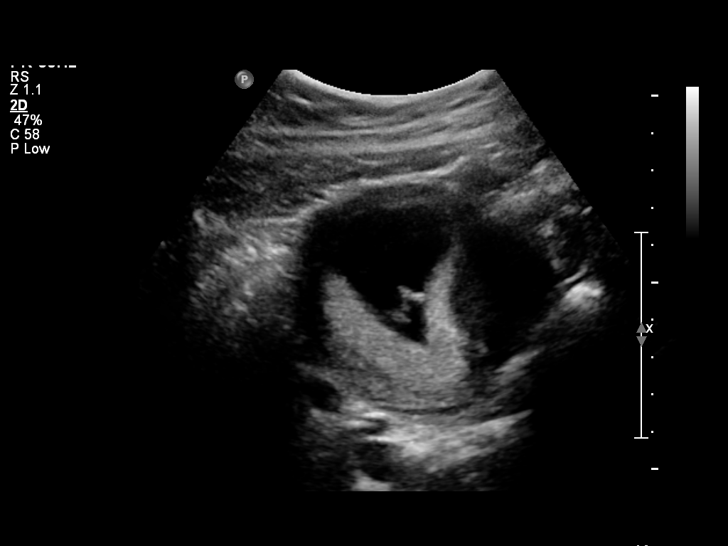
[im 19/46]
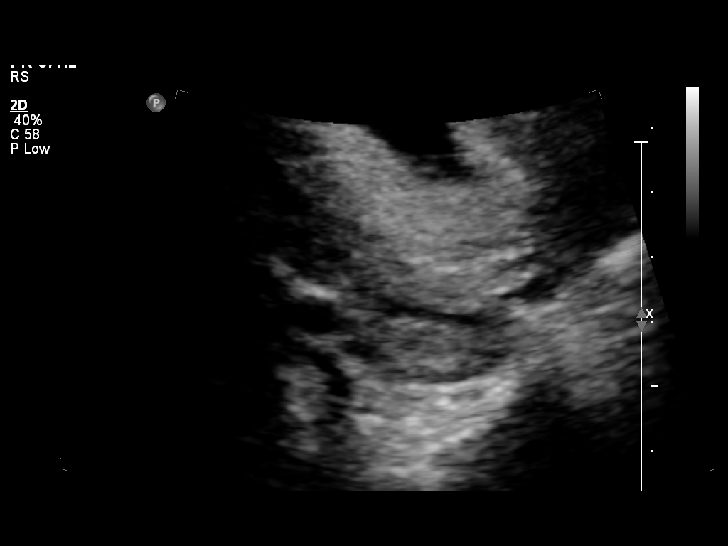
[im 22/46]
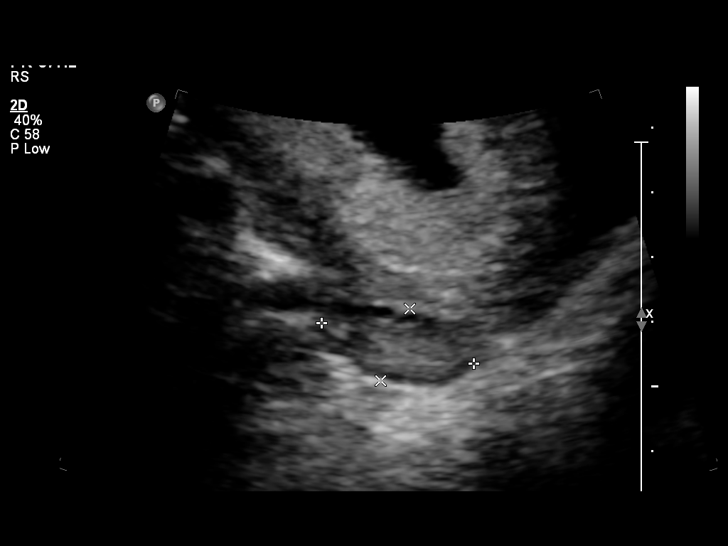
[im 26/46]
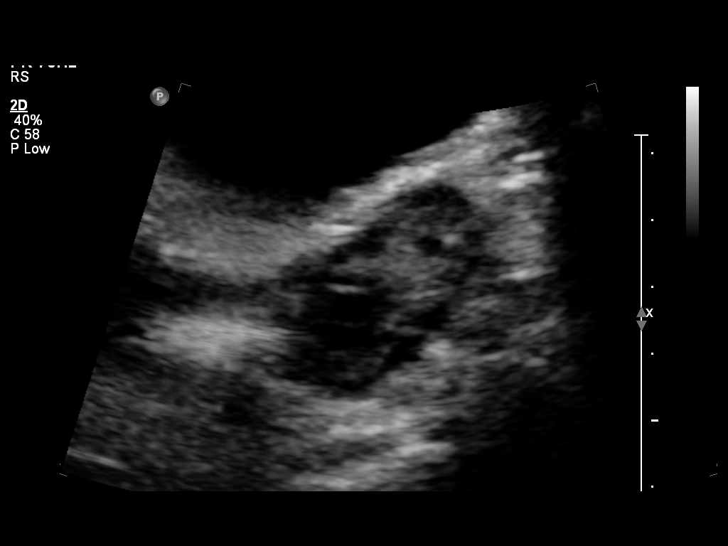
[im 29/46]
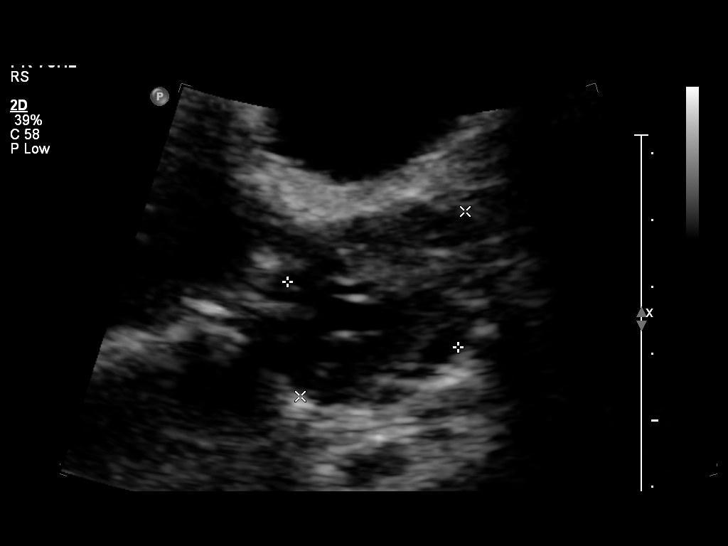
[im 32/46]
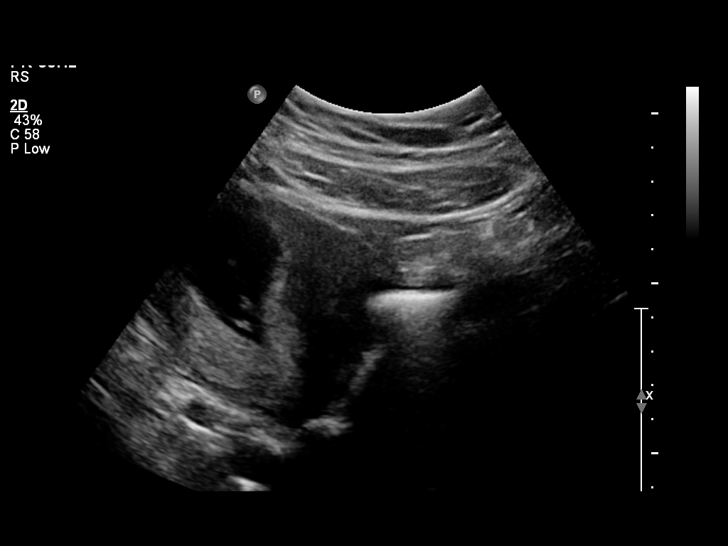
[im 36/46]
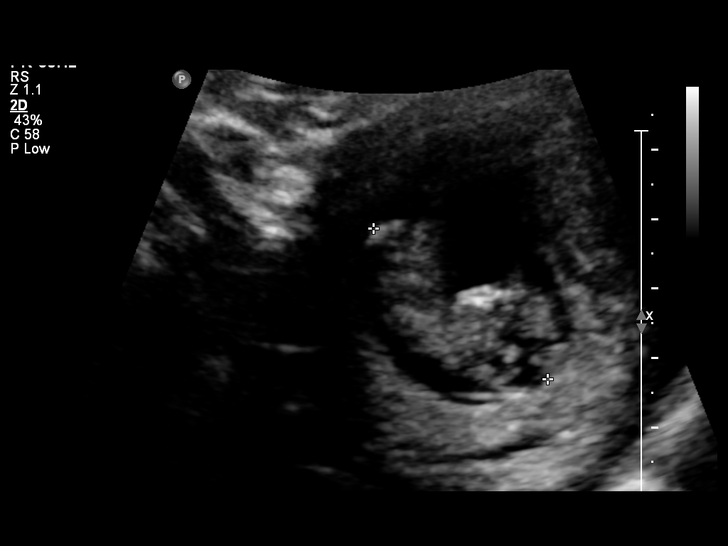
[im 39/46]
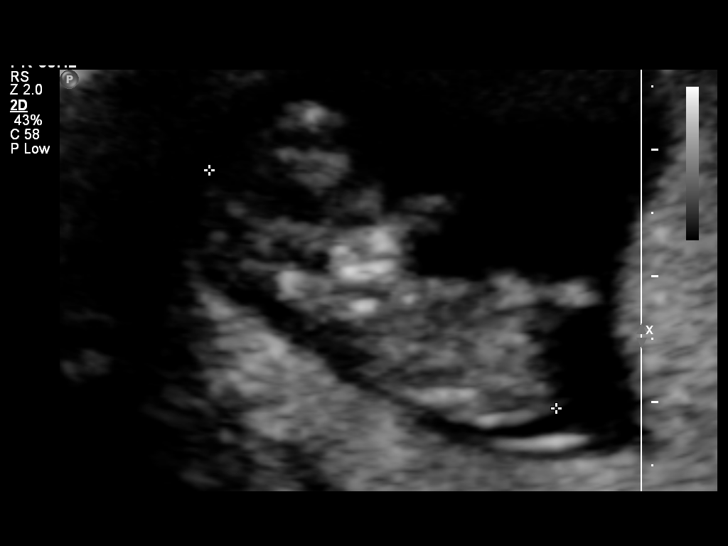
[im 42/46]
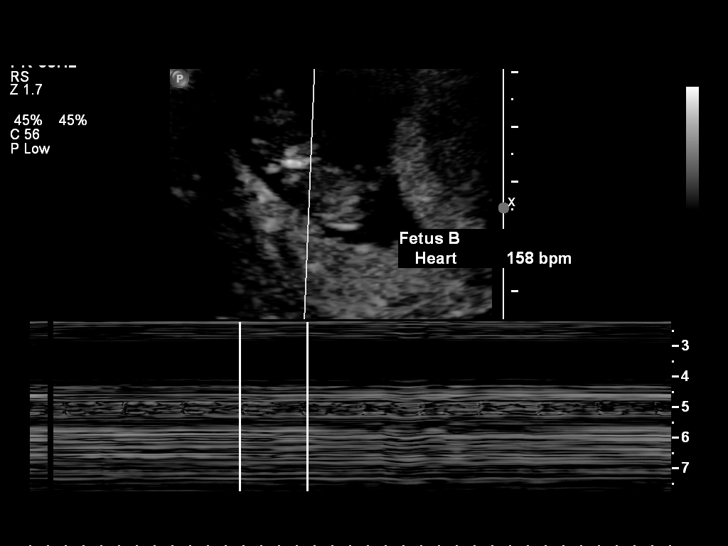
[im 46/46]
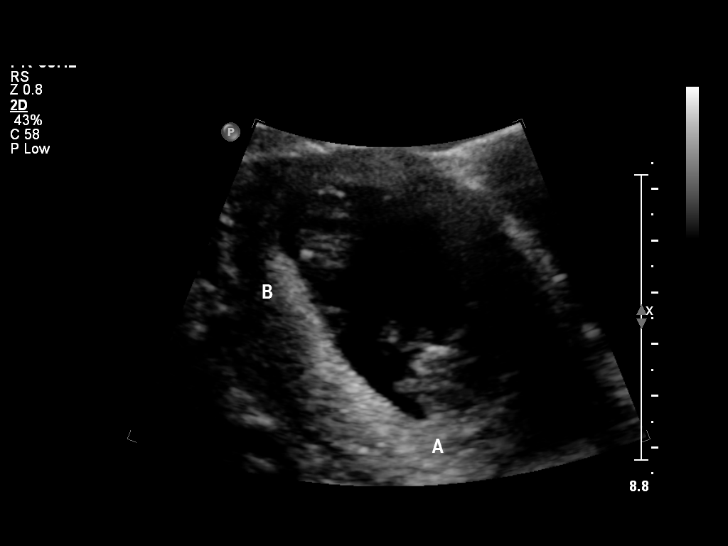

[14 of 28 positions shown; findings below may reference images not displayed]

FINDINGS: TWIN 1

Intrauterine gestational sac: Visualized/normal in shape.

Yolk sac:  Present

Embryo:  Present

Cardiac Activity: Present

Heart Rate: 155 bpm

CRL:  3.3  mm   10 w 2d                  US EDC: 05/29/2014

TWIN 2

Intrauterine gestational sac: Visualized/normal in shape.

Yolk sac:  Present

Embryo:  Present

Cardiac Activity: Present

Heart Rate: 158 bpm

CRL:  3.28  mm   10 w to d                  US EDC: 05/29/2014

Maternal uterus/adnexae: Normal.  No free fluid.
IMPRESSION: Normal first trimester appearance of twin pregnancy as above.
